# Patient Record
Sex: Female | Born: 1990 | Race: Asian | Hispanic: No | Marital: Single | Smoking: Never smoker
Health system: Southern US, Community
[De-identification: ages and names within clinical notes are randomized; demographics above are authoritative.]

## PROBLEM LIST (undated history)

## (undated) HISTORY — PX: ECTOPIC PREGNANCY SURGERY: SHX613

---

## 2013-04-12 ENCOUNTER — Other Ambulatory Visit: Payer: Self-pay | Admitting: Gastroenterology

## 2013-04-12 DIAGNOSIS — R102 Pelvic and perineal pain: Secondary | ICD-10-CM

## 2013-04-12 DIAGNOSIS — R1011 Right upper quadrant pain: Secondary | ICD-10-CM

## 2013-04-18 ENCOUNTER — Ambulatory Visit
Admission: RE | Admit: 2013-04-18 | Discharge: 2013-04-18 | Disposition: A | Discharge status: Home / Self Care | Payer: 59 | Source: Ambulatory Visit | Attending: Gastroenterology | Admitting: Gastroenterology

## 2013-04-18 ENCOUNTER — Ambulatory Visit
Admission: RE | Admit: 2013-04-18 | Discharge: 2013-04-18 | Disposition: A | Discharge status: Home / Self Care | Payer: No Typology Code available for payment source | Source: Ambulatory Visit | Attending: Gastroenterology | Admitting: Gastroenterology

## 2013-04-18 DIAGNOSIS — R102 Pelvic and perineal pain: Secondary | ICD-10-CM

## 2013-04-18 DIAGNOSIS — R1011 Right upper quadrant pain: Secondary | ICD-10-CM

## 2013-09-18 ENCOUNTER — Emergency Department (HOSPITAL_COMMUNITY)
Admission: EM | Admit: 2013-09-18 | Discharge: 2013-09-18 | Disposition: A | Discharge status: Home / Self Care | Payer: 59 | Attending: Emergency Medicine | Admitting: Emergency Medicine

## 2013-09-18 ENCOUNTER — Encounter (HOSPITAL_COMMUNITY): Payer: Self-pay | Admitting: Emergency Medicine

## 2013-09-18 DIAGNOSIS — N390 Urinary tract infection, site not specified: Secondary | ICD-10-CM

## 2013-09-18 DIAGNOSIS — Z3202 Encounter for pregnancy test, result negative: Secondary | ICD-10-CM | POA: Insufficient documentation

## 2013-09-18 DIAGNOSIS — Z880 Allergy status to penicillin: Secondary | ICD-10-CM | POA: Insufficient documentation

## 2013-09-18 LAB — URINALYSIS, ROUTINE W REFLEX MICROSCOPIC
BILIRUBIN URINE: NEGATIVE
Glucose, UA: NEGATIVE mg/dL
Ketones, ur: NEGATIVE mg/dL
NITRITE: NEGATIVE
Protein, ur: NEGATIVE mg/dL
SPECIFIC GRAVITY, URINE: 1.004 — AB (ref 1.005–1.030)
UROBILINOGEN UA: 0.2 mg/dL (ref 0.0–1.0)
pH: 6.5 (ref 5.0–8.0)

## 2013-09-18 LAB — URINE MICROSCOPIC-ADD ON

## 2013-09-18 LAB — POCT PREGNANCY, URINE: Preg Test, Ur: NEGATIVE

## 2013-09-18 MED ORDER — CIPROFLOXACIN HCL 250 MG PO TABS
250.0000 mg | ORAL_TABLET | Freq: Two times a day (BID) | ORAL | Status: AC
Start: 2013-09-18 — End: ?

## 2013-09-18 NOTE — ED Notes (Signed)
Patient is alert and oriented x3.  She is complaining of difficulty urinating. She states she can produce some urine but it is difficult.  She has been to  See a urologist and was told she needed to have a cystoscope done to find the Problem.  Currently she rates her pain 7 of 10.

## 2013-09-18 NOTE — ED Provider Notes (Signed)
CSN: [id number]     Arrival date & time 09/18/13  [id number] History   First MD Initiated Contact with Patient 09/18/13 2006     Chief Complaint  Patient presents with  . Dysuria     (Consider location/radiation/quality/duration/timing/severity/associated sxs/prior Treatment) The history is provided by the patient. No language interpreter was used.  Tara Carter is a 23 y/o F with PMHx of ectopic pregnancy surgery presenting to the ED with difficulty urinating that started yesterday. Patient reported that the only way that she can urinate is "squatting." Reported that she has been experiencing dysuria with each urination. Reported that she has been having suprapubic discomfort. Reported that she has a pressure sensation in the suprapubic region. Stated that she has not been drinking water because she does not want to have to continue to urinate. Patient reported that she has an appointment with Urology Friday - 09/22/2013. Patient reported that she frequently gets UTIs. Reported that she is not sexually active - stated that she has not had sex within the past month. Denied fever, chills, chest pain, shortness of breath, difficulty breathing, nausea, vomiting, diarrhea, melena, hematochezia, hematuria. PCP [facility]  History reviewed. No pertinent past medical history. Past Surgical History  Procedure Laterality Date  . Ectopic pregnancy surgery     History reviewed. No pertinent family history. History  Substance Use Topics  . Smoking status: Never Smoker   . Smokeless tobacco: Not on file  . Alcohol Use: Yes   OB History   Grav Para Term Preterm Abortions TAB SAB Ect Mult Living                 Review of Systems  Constitutional: Negative for fever and chills.  Respiratory: Negative for chest tightness and shortness of breath.   Cardiovascular: Negative for chest pain.  Gastrointestinal: Positive for abdominal pain. Negative for nausea, vomiting, diarrhea, constipation and blood in  stool.  Genitourinary: Positive for dysuria. Negative for hematuria, flank pain, vaginal bleeding, vaginal discharge and vaginal pain.  Musculoskeletal: Positive for back pain (lower back pain ). Negative for neck pain.  All other systems reviewed and are negative.      Allergies  Penicillins  Home Medications   Current Outpatient Rx  Name  Route  Sig  Dispense  Refill  . OVER THE COUNTER MEDICATION      Traditional Cong[place]inese medicine for throat.         Marland Kitchen. OVER THE COUNTER MEDICATION      Traditional chinese medicine for throat.         . ciprofloxacin (CIPRO) 250 MG tablet   Oral   Take 1 tablet (250 mg total) by mouth 2 (two) times daily.   14 tablet   0    BP 100/64  Pulse 78  Temp(Src) 98.3 F (36.8 C) (Oral)  Resp 18  Ht 5\' 3"  (1.6 m)  Wt 108 lb (48.988 kg)  BMI 19.14 kg/m2  SpO2 100%  LMP 08/14/2013 Physical Exam  Nursing note and vitals reviewed. Constitutional: She is oriented to person, place, and time. She appears well-developed and well-nourished. No distress.  HENT:  Head: Normocephalic and atraumatic.  Eyes: Conjunctivae and EOM are normal. Right eye exhibits no discharge. Left eye exhibits no discharge.  Neck: Normal range of motion. Neck supple. No tracheal deviation present.  Cardiovascular: Normal rate, regular rhythm and normal heart sounds.  Exam reveals no friction rub.   No murmur heard. Pulses:      Radial pulses are  2+ on the right side, and 2+ on the left side.  Pulmonary/Chest: Effort normal and breath sounds normal. No respiratory distress. She has no wheezes. She has no rales.  Abdominal: Soft. Bowel sounds are normal. There is tenderness. There is no guarding.  Musculoskeletal: Normal range of motion.  Full ROM to upper and lower extremities without difficulty noted, negative ataxia noted.  Lymphadenopathy:    She has no cervical adenopathy.  Neurological: She is alert and oriented to person, place, and time. She exhibits normal  muscle tone. Coordination normal.  Skin: Skin is warm and dry. She is not diaphoretic.    ED Course  Procedures (including critical care time)  Results for orders placed during the hospital encounter of 09/18/13  URINALYSIS, ROUTINE W REFLEX MICROSCOPIC      Result Value Ref Range   Color, Urine YELLOW  YELLOW   APPearance CLEAR  CLEAR   Specific Gravity, Urine 1.004 (*) 1.005 - 1.030   pH 6.5  5.0 - 8.0   Glucose, UA NEGATIVE  NEGATIVE mg/dL   Hgb urine dipstick TRACE (*) NEGATIVE   Bilirubin Urine NEGATIVE  NEGATIVE   Ketones, ur NEGATIVE  NEGATIVE mg/dL   Protein, ur NEGATIVE  NEGATIVE mg/dL   Urobilinogen, UA 0.2  0.0 - 1.0 mg/dL   Nitrite NEGATIVE  NEGATIVE   Leukocytes, UA TRACE (*) NEGATIVE  URINE MICROSCOPIC-ADD ON      Result Value Ref Range   Squamous Epithelial / LPF RARE  RARE   WBC, UA 0-2  <3 WBC/hpf   RBC / HPF 0-2  <3 RBC/hpf   Bacteria, UA RARE  RARE  POCT PREGNANCY, URINE      Result Value Ref Range   Preg Test, Ur NEGATIVE  NEGATIVE    Labs Review Labs Reviewed  URINALYSIS, ROUTINE W REFLEX MICROSCOPIC - Abnormal; Notable for the following:    Specific Gravity, Urine 1.004 (*)    Hgb urine dipstick TRACE (*)    Leukocytes, UA TRACE (*)    All other components within normal limits  URINE MICROSCOPIC-ADD ON  POCT PREGNANCY, URINE   Imaging Review No results found.  EKG Interpretation   None       MDM   Final diagnoses:  UTI (urinary tract infection)   Filed Vitals:   09/18/13 1936 09/18/13 2212  BP: 117/77 100/64  Pulse: 112 78  Temp: 97.6 F (36.4 C) 98.3 F (36.8 C)  TempSrc: Oral Oral  Resp: 20 18  Height: 5\' 3"  (1.6 m)   Weight: 108 lb (48.988 kg)   SpO2: 97% 100%    Patient presenting to emergency department with difficulty urinating starting yesterday. Stated that in order for her to urinate she has to "squat"-reported that each urination she is a burning sensation. Stated that she feels a pressure sensation to suprapubic  region. Stated that she frequently has urinary tract infections. Reported that she has appointment with urology on Friday, 09/22/2013. Stated that she's not sexually active-reported she has not been sexually active since one month ago. Alert and oriented. GCS 15. Heart rate and rhythm normal. Lungs clear to auscultation. Radial pulses 2+ bilaterally. Bowel sounds normoactive in all 4 quadrants, soft, negative pain upon palpation-mild discomfort upon palpation to suprapubic region. Negative acute abdomen, negative peritoneal signs-nonsurgical abdomen noted. Negative bilateral CVA tenderness. Urine pregnancy negative. Urinalysis noted trace hemoglobin with trace of leukocytes. Specific gravity low-1.004. Bladder scan 69 after urination. Patient requesting CT scan. Patient requesting to have cystoscopy performed -  reported that she has an appointment with Urology on Friday.  Doubt ectopic pregnancy. Doubt TOA. Doubt PID. Suspicion to be urinary tract infection. Patient stable, afebrile. Discharged patient. Discharged patient with antibiotics. Discussed with patient to rest and stay hydrated to drink plenty of fluids. Recommended diluted cranberry juice. Discussed with patient to keep appointment with urology on Friday, 09/22/2013. Referred to primary care provider. Discussed with patient to closely monitor symptoms and if symptoms are to worsen or change to report back to the ED - strict return instructions given.  Patient agreed to plan of care, understood, all questions answered.   Raymon Mutton, PA-C 09/20/13 1136

## 2013-09-18 NOTE — Discharge Instructions (Signed)
Please call your doctor for a followup appointment within 24-48 hours. When you talk to your doctor please let them know that you were seen in the emergency department and have them acquire all of your records so that they can discuss the findings with you and formulate a treatment plan to fully care for your new and ongoing problems. Please keep appointment with urology on Friday, 09/22/2013 Please take antibiotics as prescribed and on a full stomach Please rest and drink plenty of fluids Can drink diluted cranberry juice Please continue to monitor symptoms closely and if symptoms are to worsen or change (fever greater than 101, chills, sweating, chest pain, shortness of breath, difficulty breathing, nausea, vomiting, stomach pain, worsening discomfort upon urination, inability to urinate at all, changes to bowel movements, vaginal discharge, vaginal bleeding) please report back to the ED   Urinary Tract Infection Urinary tract infections (UTIs) can develop anywhere along your urinary tract. Your urinary tract is your body's drainage system for removing wastes and extra water. Your urinary tract includes two kidneys, two ureters, a bladder, and a urethra. Your kidneys are a pair of bean-shaped organs. Each kidney is about the size of your fist. They are located below your ribs, one on each side of your spine. CAUSES Infections are caused by microbes, which are microscopic organisms, including fungi, viruses, and bacteria. These organisms are so small that they can only be seen through a microscope. Bacteria are the microbes that most commonly cause UTIs. SYMPTOMS  Symptoms of UTIs may vary by age and gender of the patient and by the location of the infection. Symptoms in young women typically include a frequent and intense urge to urinate and a painful, burning feeling in the bladder or urethra during urination. Older women and men are more likely to be tired, shaky, and weak and have muscle aches and  abdominal pain. A fever may mean the infection is in your kidneys. Other symptoms of a kidney infection include pain in your back or sides below the ribs, nausea, and vomiting. DIAGNOSIS To diagnose a UTI, your caregiver will ask you about your symptoms. Your caregiver also will ask to provide a urine sample. The urine sample will be tested for bacteria and white blood cells. White blood cells are made by your body to help fight infection. TREATMENT  Typically, UTIs can be treated with medication. Because most UTIs are caused by a bacterial infection, they usually can be treated with the use of antibiotics. The choice of antibiotic and length of treatment depend on your symptoms and the type of bacteria causing your infection. HOME CARE INSTRUCTIONS  If you were prescribed antibiotics, take them exactly as your caregiver instructs you. Finish the medication even if you feel better after you have only taken some of the medication.  Drink enough water and fluids to keep your urine clear or pale yellow.  Avoid caffeine, tea, and carbonated beverages. They tend to irritate your bladder.  Empty your bladder often. Avoid holding urine for long periods of time.  Empty your bladder before and after sexual intercourse.  After a bowel movement, women should cleanse from front to back. Use each tissue only once. SEEK MEDICAL CARE IF:   You have back pain.  You develop a fever.  Your symptoms do not begin to resolve within 3 days. SEEK IMMEDIATE MEDICAL CARE IF:   You have severe back pain or lower abdominal pain.  You develop chills.  You have nausea or vomiting.  You  have continued burning or discomfort with urination. MAKE SURE YOU:   Understand these instructions.  Will watch your condition.  Will get help right away if you are not doing well or get worse. Document Released: 04/29/2005 Document Revised: 01/19/2012 Document Reviewed: 08/28/2011 [facility] Patient Information 2014  [place], [place].   Emergency Department Resource Guide 1) Find a Doctor and Pay Out of Pocket Although you won't have to find out who is covered by your insurance plan, it is a good idea to ask around and get recommendations. You will then need to call the office and see if the doctor you have chosen will accept you as a new patient and what types of options they offer for patients who are self-pay. Some doctors offer discounts or will set up payment plans for their patients who do not have insurance, but you will need to ask so you aren't surprised when you get to your appointment.  2) Contact Your Local Health Department Not all health departments have doctors that can see patients for sick visits, but many do, so it is worth a call to see if yours does. If you don't know where your local health department is, you can check in your phone book. The CDC also has a tool to help you locate your state's health department, and many state websites also have listings of all of their local health departments.  3) Find a Walk-in Clinic If your illness is not likely to be very severe or complicated, you may want to try a walk in clinic. These are popping up all over the country in pharmacies, drugstores, and shopping centers. They're usually staffed by nurse practitioners or physician assistants that have been trained to treat common illnesses and complaints. They're usually fairly quick and inexpensive. However, if you have serious medical issues or chronic medical problems, these are probably not your best option.  No Primary Care Doctor: - Call Health Connect at  [phone] - they can help you locate a primary care doctor that  accepts your insurance, provides certain services, etc. - Physician Referral Service- [phone]  Chronic Pain Problems: Organization         Address  Phone   Notes  [facility]  [phone] Patients need to be referred by their primary care doctor.    Medication Assistance: Organization         Address  Phone   Notes  [facility] [address] [place] [place], [place] [address] [phone] --Must be a resident of [facility] -- Must have NO insurance coverage whatsoever (no Medicaid/ Medicare, etc.) -- The pt. MUST have a primary care doctor that directs their care regularly and follows them in the community   [facility]  [phone]   [employer]  [phone]    Agencies that provide inexpensive medical care: Organization         Address  Phone   Notes  [facility]  [phone]   [facility]    [phone]   [facility] [address] [place], [place] [address] [phone]   [facility] [address] [place] [place] [phone]   Planned Parenthood    [phone]   [facility]    [phone]   Community [facility] and [facility]  [address], [place] Phone:  [phone], Fax:  [phone] Hours of Operation:  9 am - 6 pm, M-F.  Also accepts Medicaid/Medicare and self-pay.  [facility]  [address], [place] Phone: [phone], Fax: [phone]. Hours of Operation:  8:30 am - 5:30 pm, M-F.  Also accepts Medicaid and self-pay.  [facility] [place] [address], [place] Phone: [phone]   [facility] [address] Natasha Bence [place], [place] [phone], Ext. [phone] Mondays & Thursdays: 7-9 AM.  First 15 patients are seen on a first come, first serve basis.    Medicaid-accepting [facility] Providers:  Organization         Address  Phone   Notes  [facility] [address], [place] [phone] Also accepts self-pay patients.  [facility] [address] Laurell Josephs [place], [place]  [phone]   [facility] [address], [place] [phone]   [facility] [address] [place] [phone]   Renaye Rakers [address], [place]   [phone] Only accepts [place] Access [place] patients after they have their name applied to their card.   Self-Pay (no insurance) in [facility]:  Organization         Address  Phone   Notes  Sickle Cell Patients, [facility] [address] [place], [place] [phone]   [facility] [address] [place], [place] [phone]   [facility]  [address], [place] [phone]   [facility]/Dr. Osei-Bonsu  [address], [place] or [address], [place] [phone] Phone number for both [place] and [place] locations is the same.  [facility]gent Medical and [facility] [address] [place] [phone]   [facility] [address] [place] or [address] [phone]   [facility] [address], [place] [phone], phone; [phone], fax Sees patients 1st and 3rd Saturday of every month.  Must not qualify for public or private insurance (i.e. Medicaid, Medicare, [place] Health Choice, Veterans' Benefits)  Household income should be no more than 200% of the poverty level The clinic cannot treat you if you are pregnant or think you are pregnant  Sexually transmitted diseases are not treated at the clinic.    Dental Care: Organization         Address  Phone  Notes  [facility] Department of [facility] [address] [place], [place] [phone] Accepts children up to age 34 who are enrolled in [place] or [place] Health Choice; pregnant women with a Medicaid card; and children who have applied for Medicaid or [place] Health Choice, but were declined, whose parents can pay a reduced fee at time of service.    [facility]  [address], [place] [phone] Accepts children up to age 14 who are enrolled in [place] or [place] Health Choice; pregnant women with a Medicaid card; and children who have applied for Medicaid or [place] Health Choice, but were declined, whose parents can pay a reduced fee at time of service.  [facility]  [address] [place], [place] [phone] Patients are seen by appointment only. Walk-ins are not accepted. [facility] will see patients 63  years of age and older. Monday - Tuesday (8am-5pm) Most Wednesdays (8:30-5pm) $30 per visit, cash only  [facility]  [address], [place] [phone] Patients are seen by appointment only. Walk-ins are not accepted. [facility] will see patients 24 years of age and older. One Wednesday Evening (Monthly: Volunteer Based).  $30 per visit, cash only  [employer] [facility] [facility]  [phone] for adults; Children under age 704, call Graduate Pediatric Dentistry at [phone]. Children aged 534-14, please call [phone]) [phone] to request a pediatric application.  Dental services are provided in all areas of dental care including fillings, crowns and bridges, complete and partial dentures, implants, gum treatment, root canals, and extractions. Preventive care is also provided. Treatment is provided to both adults and children. Patients are selected via a lottery and there is often a waiting list.   [facility] [address], [place]  [phone]) [phone] [email or web address]   [facility] [address], [place], [place] [phone], Ext. [phone] Second and Fourth Thursday of each month, opens at 6:30 AM; Clinic ends at 9 AM.  Patients are seen on a first-come first-served basis, and a limited number are seen during each clinic.   [facility]  [address], [place], [place] [phone]  [phone]   Eligibility Requirements You must have lived in [place], [place], or [place] counties for at least the last three months.   You cannot be eligible for state or federal sponsored [place] Cityhealthcare insurance, including CIGNAVeterans Administration, [place]icaid, or [employer]icare.   You generally cannot be eligible for healthcare insurance through your employer.    How to apply: Eligibility screenings are held every Tuesday and Wednesday afternoon from 1:00 pm until 4:00 pm. You do not need an appointment for the interview!  [facility] [address], [place], [place] [phone]   [facility]  [phone]   [facility]  [phone]   [facility]  [phone]    Behavioral Health Resources in the Community: Intensive Outpatient Programs Organization         Address  Phone  Notes  [facility] [address], [place], [place] [phone]   [facility] [address], [place], [place] [phone]   [facility] [address], [place], [place]  [phone]   [facility] [address],  [place], [place] [phone] or [phone]   Substance Abuse Resources Organization         Address  Phone  Notes  [facility]  [phone]   [facility]  [phone]   [facility]  [phone]   [facility]  [phone]   Residential & Outpatient Substance Abuse Program  [phone]   Psychological Services Organization         Address  Phone  Notes  [facility]  [phone]- [phone]   [facility]  [phone]   [facility] [address], [place] [phone] or [phone]    Mobile Crisis Teams Organization         Address  Phone  Notes  [facility], Mobile Crisis Care Unit  [phone]   [facility]  [address] [place], [place] [phone]   [facility] [address] [place] [phone]    Self-Help/Support Groups Organization  Organization         Address  Phone             Notes  °[facility] [place] - variety of support groups  336- [phone] Call for more information  °Narcotics Anonymous (NA), [facility] [address], °[place]  2 meetings at this location  ° °Residential Treatment Programs °Organization         Address  Phone  Notes  °[facility] [address],    °[place]  [phone]   °[facility] ° [address], [place], [place] [phone]   °[facility] [address], [place] [phone] Admissions: 8am-3pm M-F  °[facility] [address]    °[place], [place] [phone]   °[facility] [address], [place], [place] [phone]   °[facility] [address]  °[place], [place] [phone]   °[facility] - Intensive Outpatient [address], [place], [place] [phone]   °[facility] ([facility] [address]  °[place], [place] [phone] or [phone]   °[facility] ([facility]) [address] , [place] [phone] Accepts Medicaid  °[facility] [address]  °[place] 1-800-659-3381 Substance Abuse/Addiction Treatment  ° °[place] [facility] Resources °Organization         Address  Phone  Notes  °[facility]  ([phone]) [phone]   °Julie Brannon, PhD [address] [place], [place]   ([phone]) [phone] or (336) [phone]   °[facility]   [address] °[place], [place] ([phone]) [phone]   °[facility] [address], [place], [place] ([phone]) [phone] Insurance/Medicaid/sponsorship through [facility]  °[facility] [address]                                    [place], [place] ([phone]) [phone]  Therapy/tele-psych/case  °[facility] [address]  ° [place],  ([phone]) [phone]    °Dr. Arfeen  ([phone]) [phone]   °[facility] [place]  [employer] [place] [facility] 1) [address], [place] °2) [address], [place] °3)  [address], [place] ([phone]) [phone] °([phone]) [phone] ° °([phone]) [phone]   °[place] Child Abuse Hotline (336) 342-1394 or (336) [phone] (After Hours)    ° ° ° °

## 2013-09-18 NOTE — ED Notes (Signed)
Bladder scan/ 69ml 

## 2013-09-22 NOTE — ED Provider Notes (Signed)
Medical screening examination/treatment/procedure(s) were performed by non-physician practitioner and as supervising physician I was immediately available for consultation/collaboration.  EKG Interpretation   None         Rolland PorterMark Yaeko Fazekas, MD 09/22/13 [phone]

## 2015-02-02 IMAGING — US US ABDOMEN LIMITED
1 series · 14 of 25 positions shown · non-contrast
Comparison: None.

CLINICAL DATA: Right upper quadrant pain.

EXAM:
US ABDOMEN LIMITED - RIGHT UPPER QUADRANT

[Series 1: us abdomen limited · 0.22mm/px · 14 of 45 slices shown]
[im 1/45]
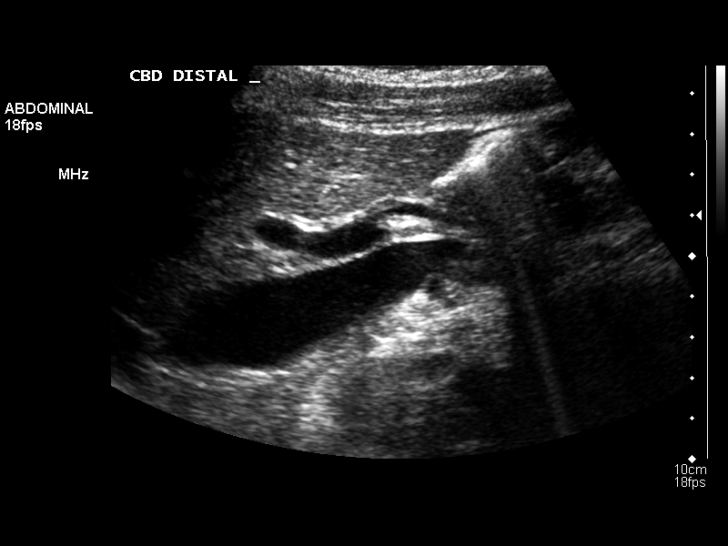
[im 4/45]
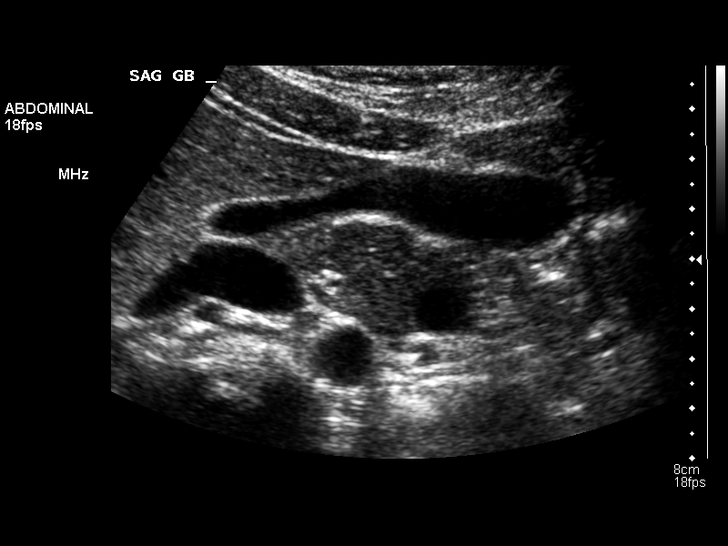
[im 8/45]
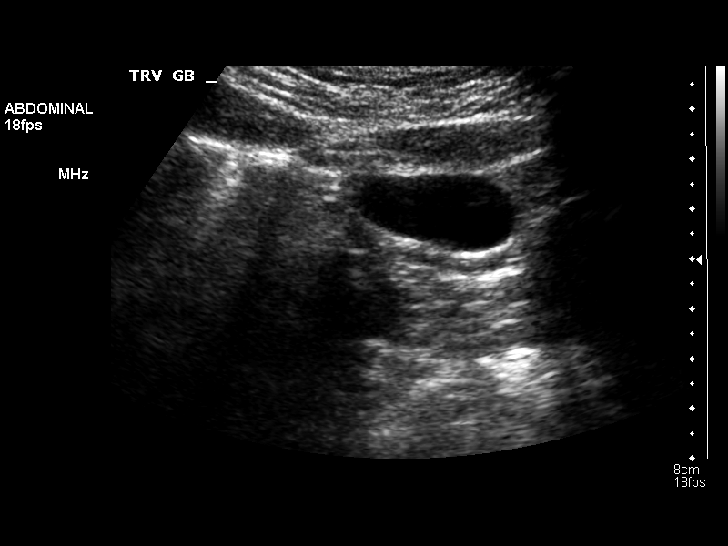
[im 12/45]
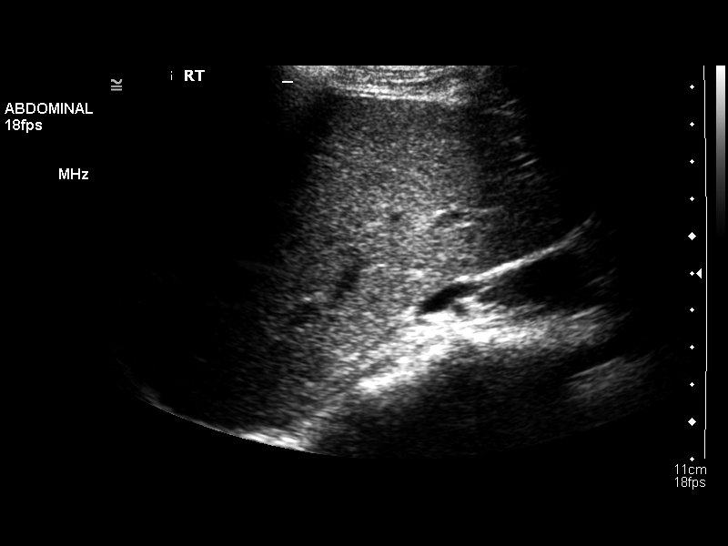
[im 15/45]
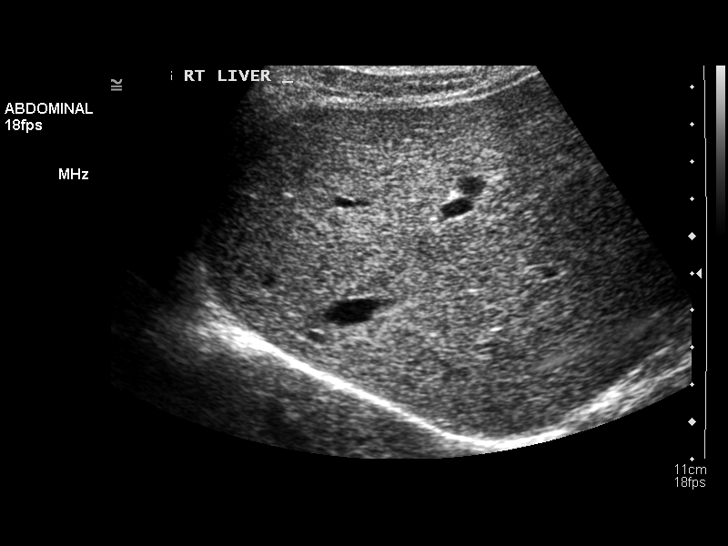
[im 17/45]
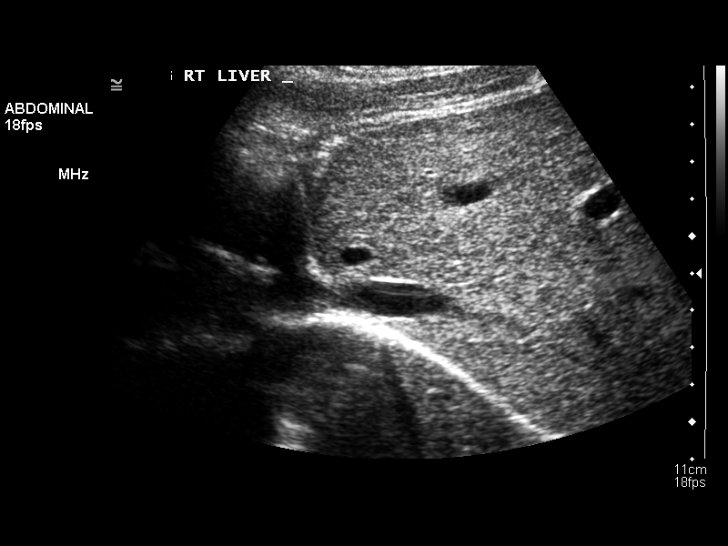
[im 21/45]
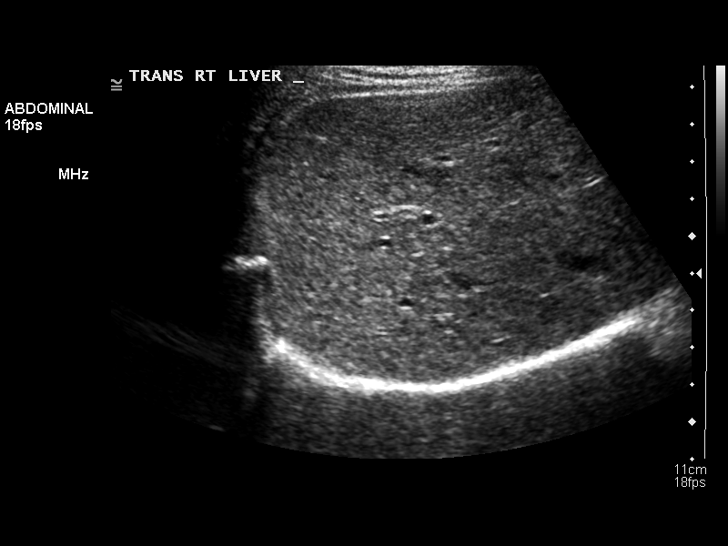
[im 24/45]
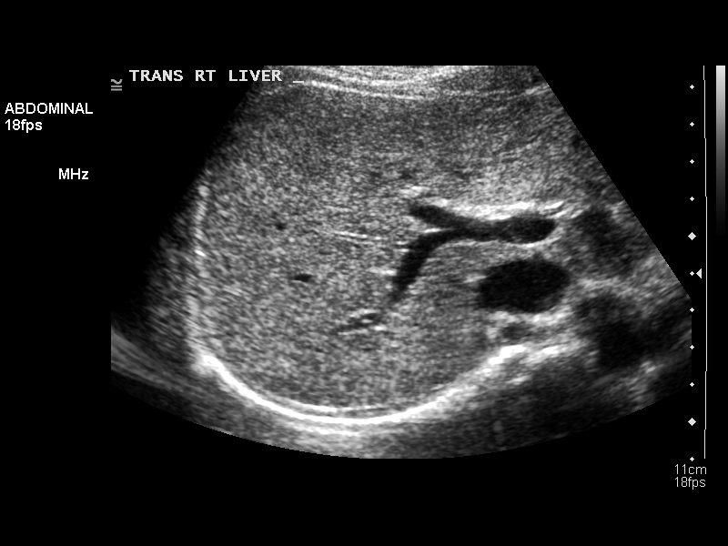
[im 28/45]
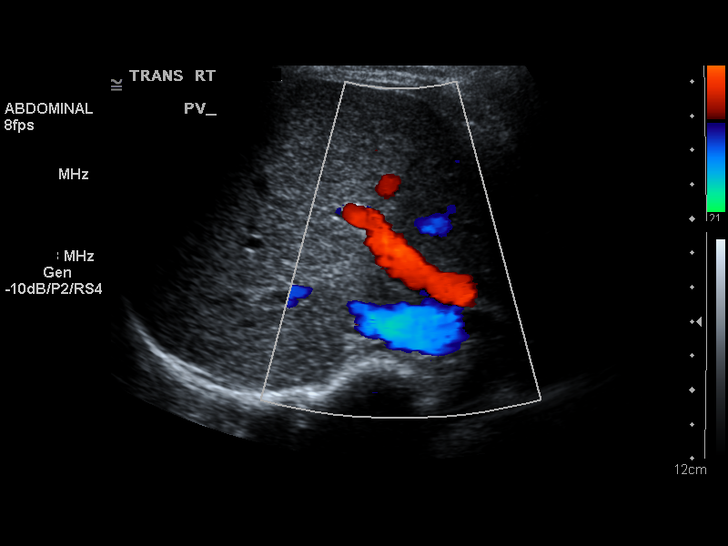
[im 30/45]
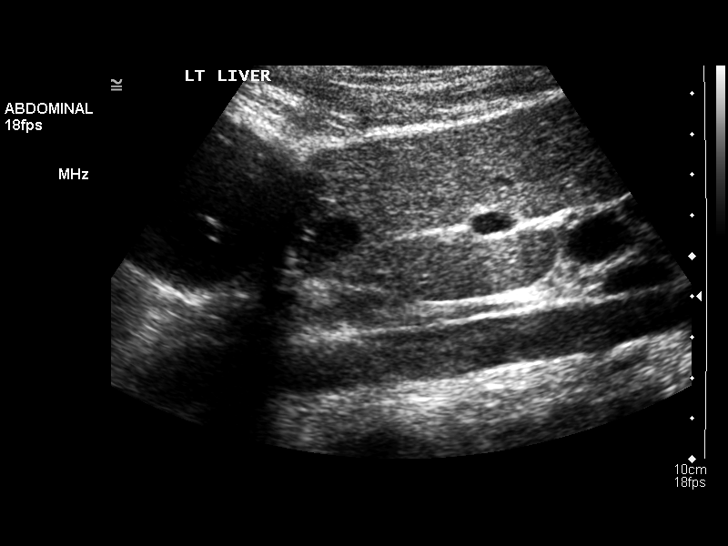
[im 34/45]
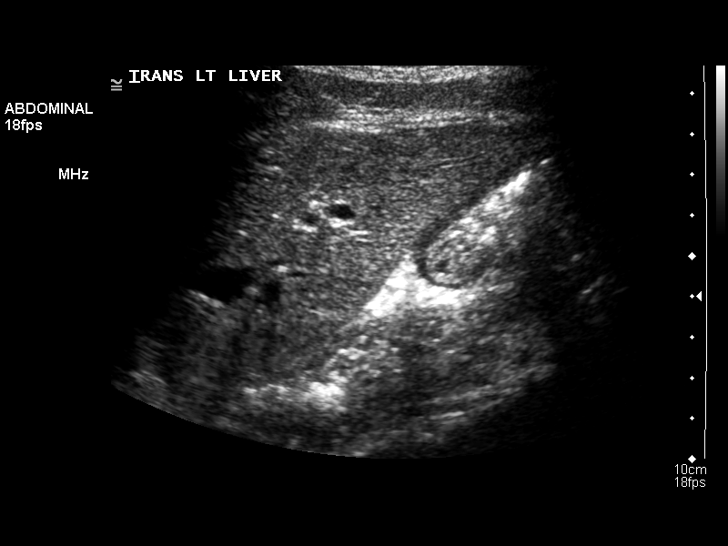
[im 37/45]
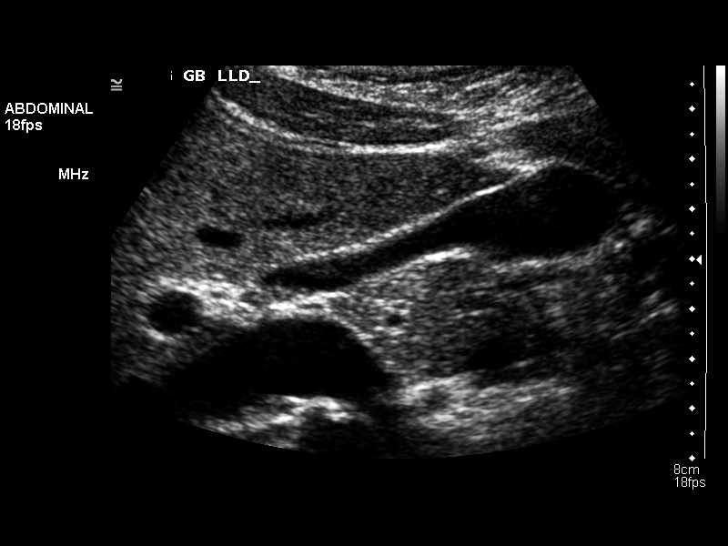
[im 41/45]
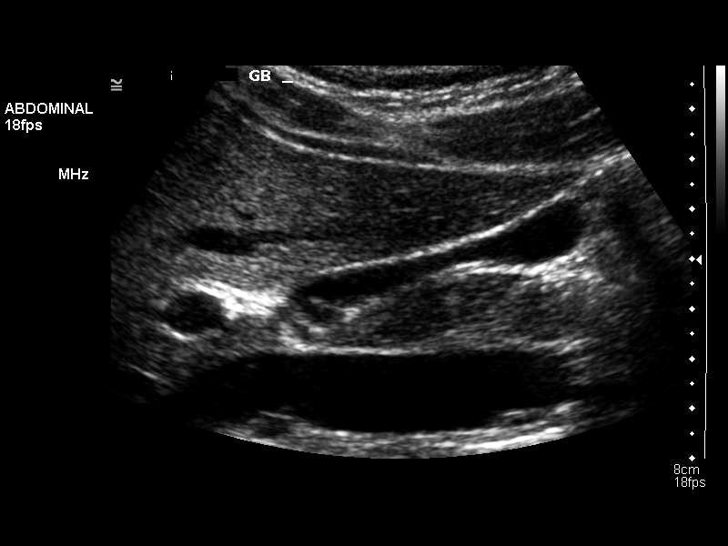
[im 45/45]
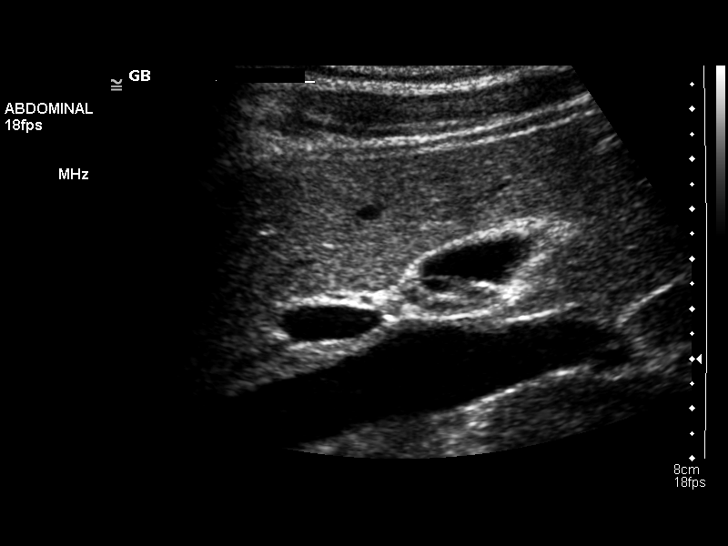

[14 of 25 positions shown; findings below may reference images not displayed]

FINDINGS: Gallbladder:

No gallstones, gallbladder wall thickening, or pericholecystic
fluid.

Common bile duct

Diameter: Normal caliber, 4 mm.

Liver:

No focal lesion identified. Within normal limits in parenchymal
echogenicity.
IMPRESSION: Negative.

## 2015-02-02 IMAGING — US US TRANSVAGINAL NON-OB
1 series · 14 of 25 positions shown · non-contrast
Comparison: None.

CLINICAL DATA: Pelvic pain



[Series 1: us transvaginal non-ob · 0.26mm/px · 14 of 61 slices shown]
[im 1/61]
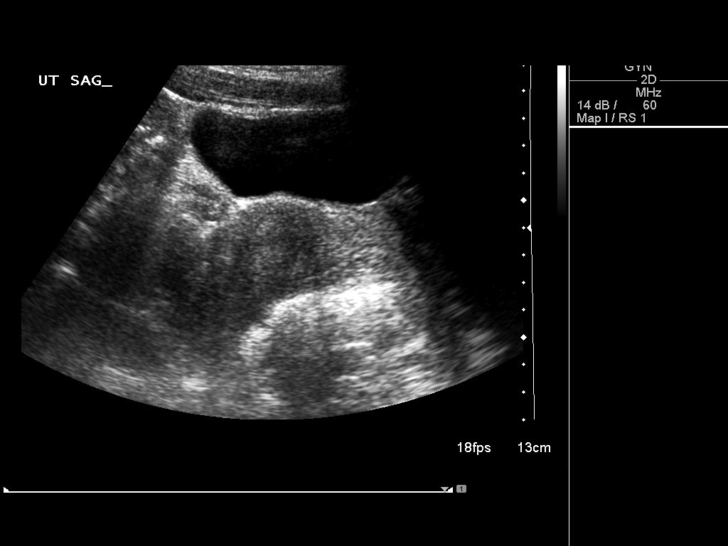
[im 6/61]
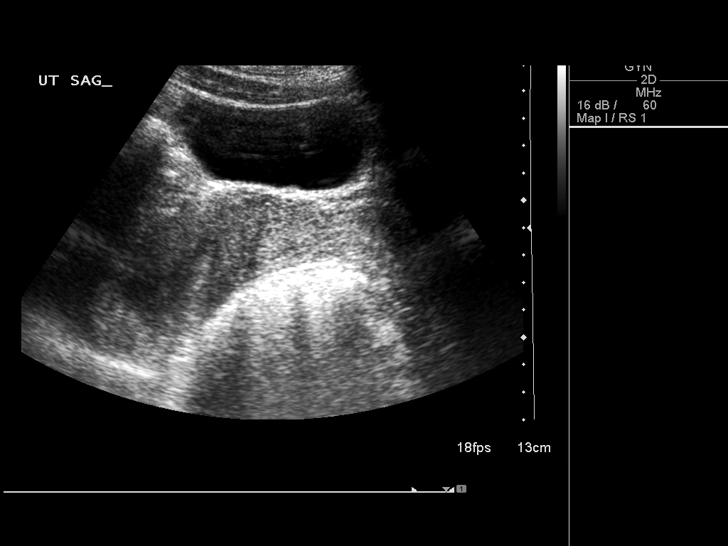
[im 11/61]
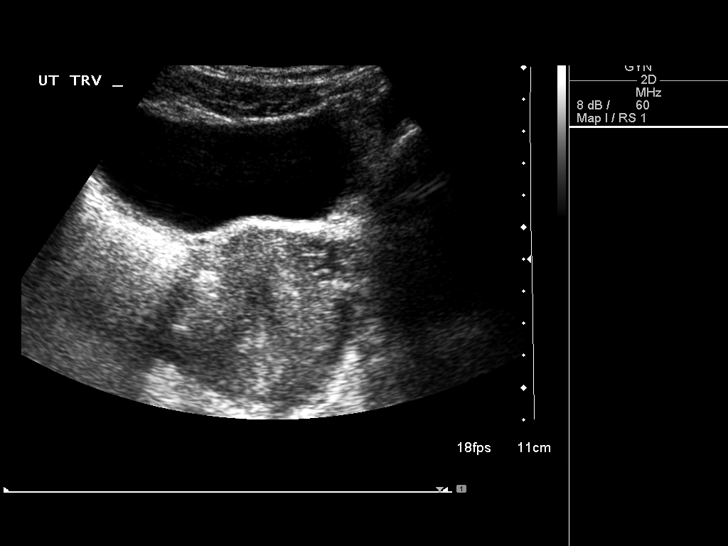
[im 16/61]
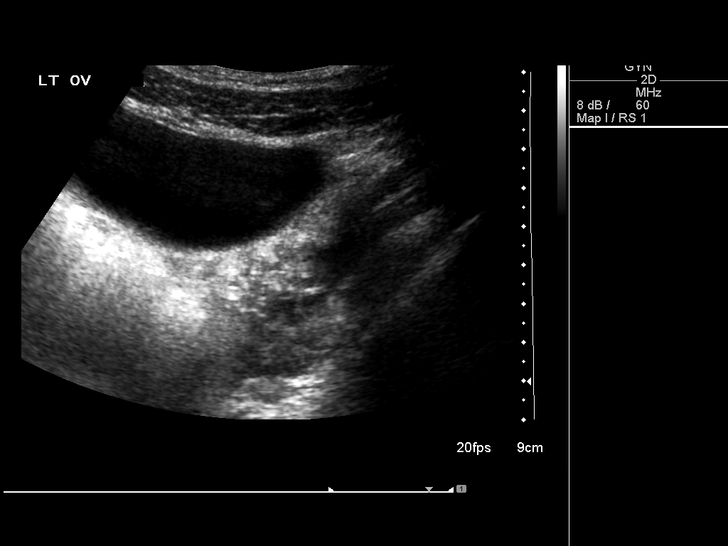
[im 21/61]
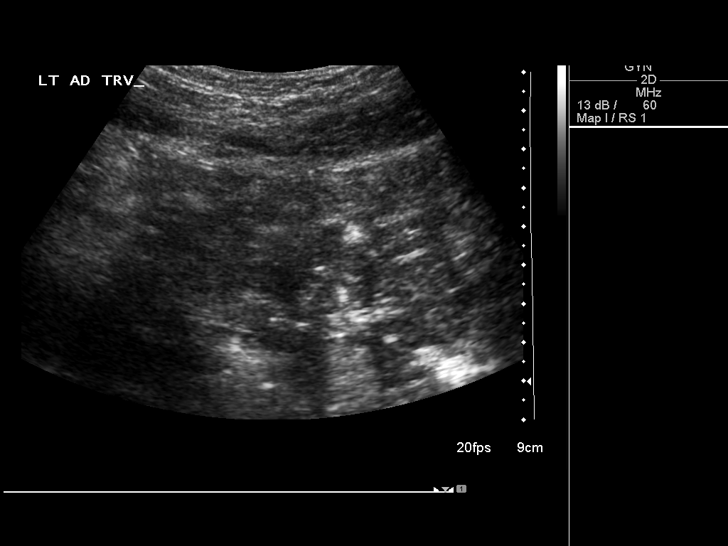
[im 23/61]
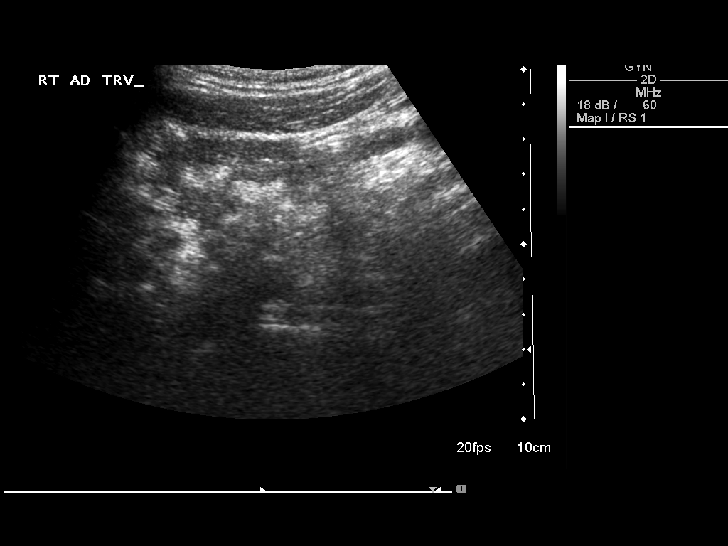
[im 28/61]
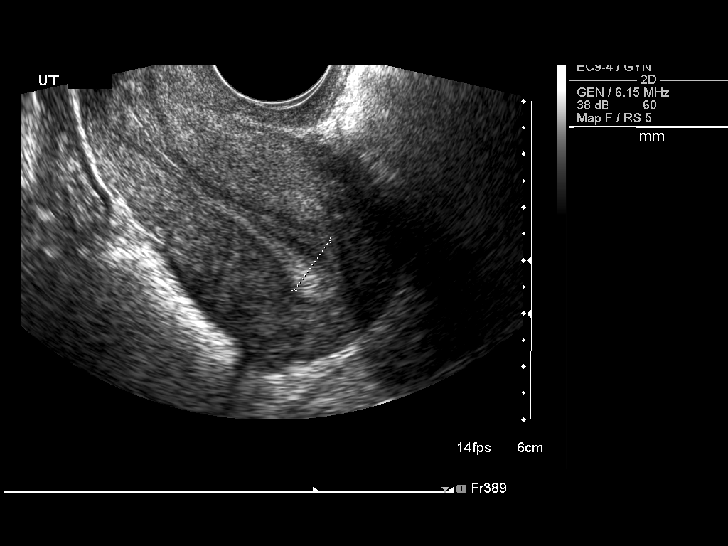
[im 33/61]
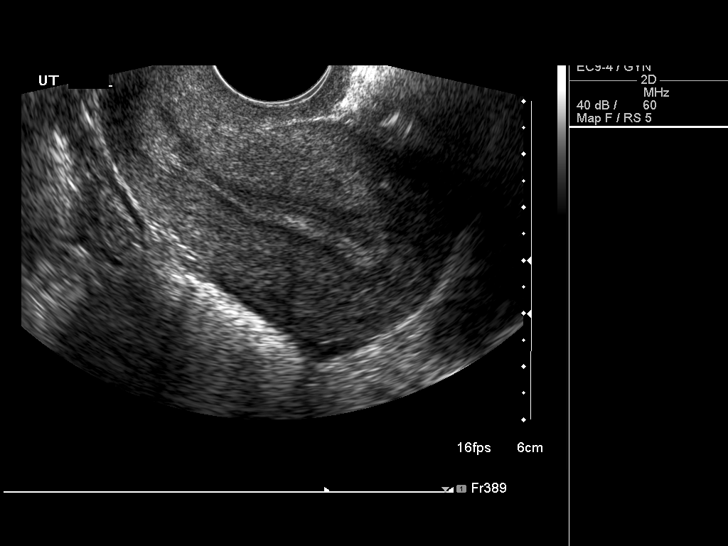
[im 38/61]
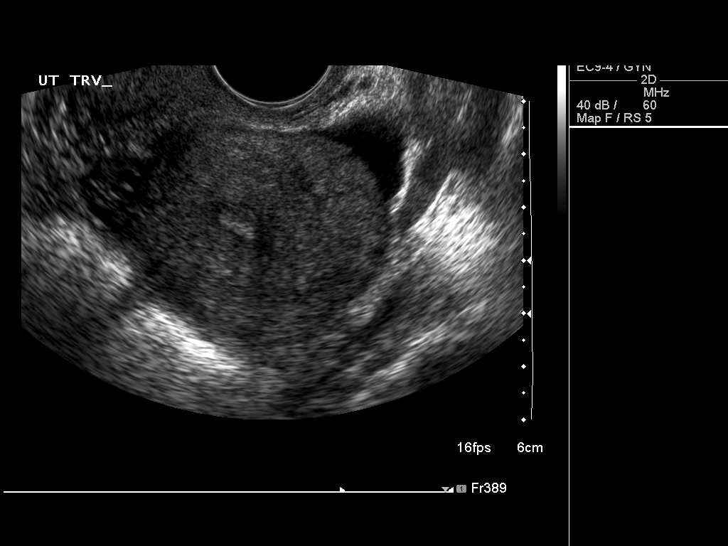
[im 41/61]
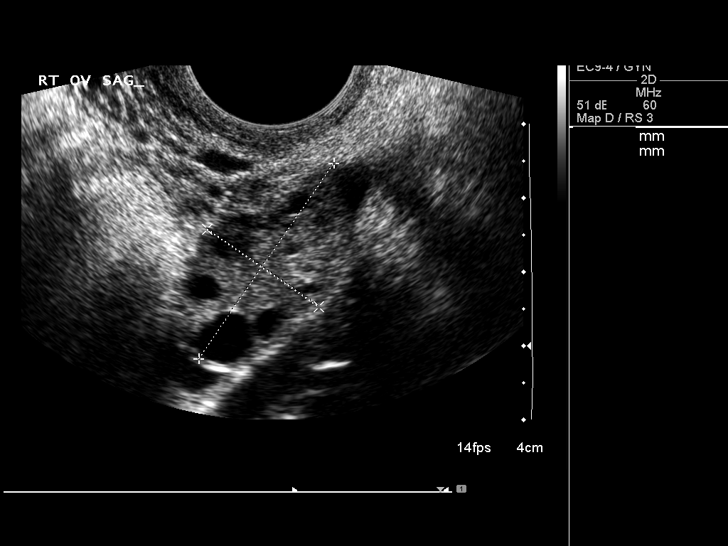
[im 46/61]
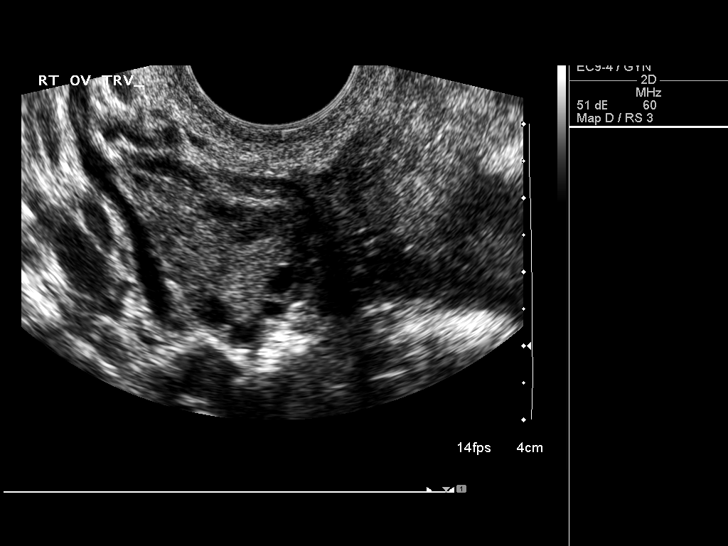
[im 51/61]
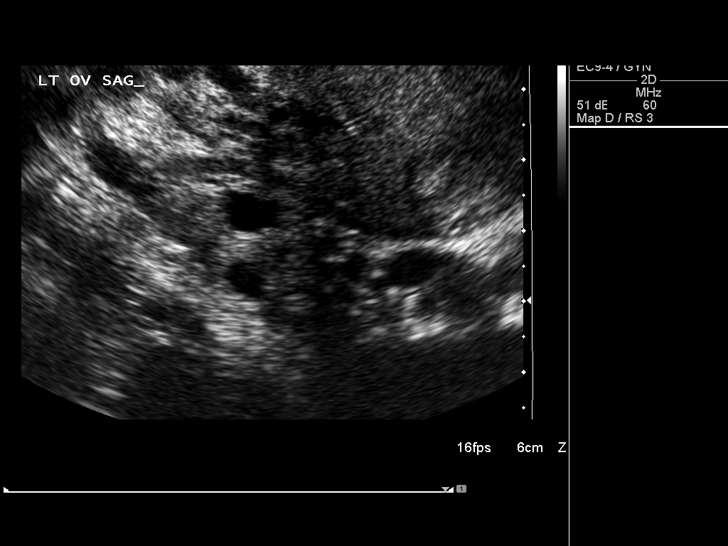
[im 56/61]
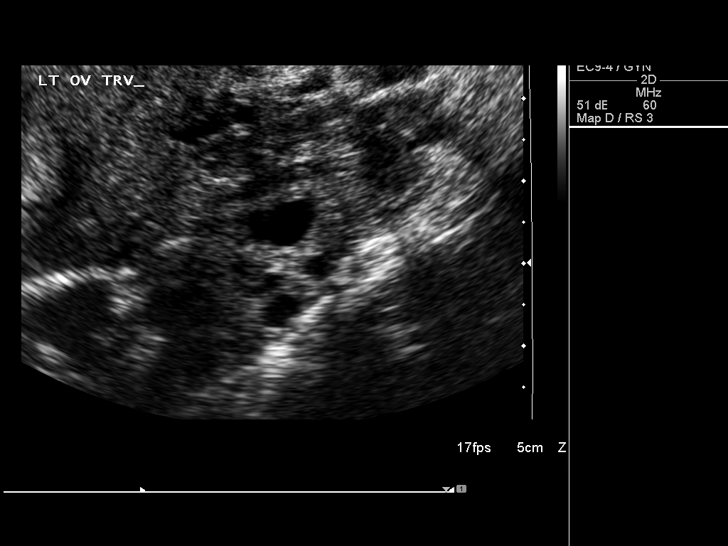
[im 61/61]
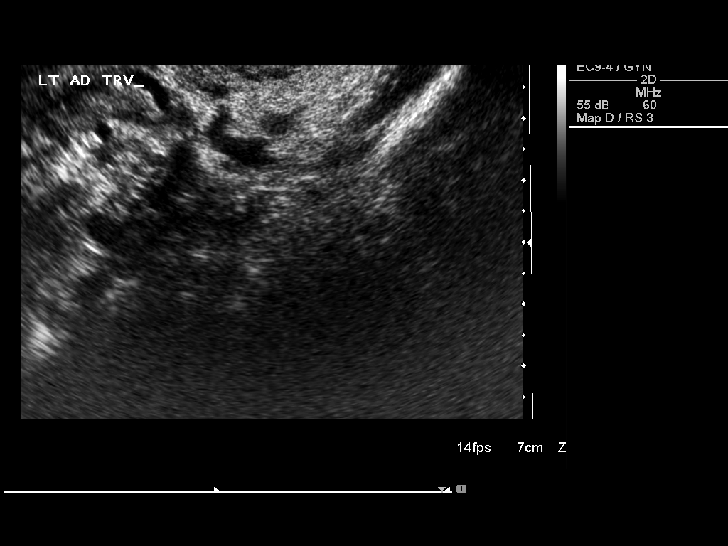

[14 of 25 positions shown; findings below may reference images not displayed]

FINDINGS: Uterus:  The uterus measures 7.3 x 4.4 x 5.3 cm.  The uterus has a
retroverted orientation.  No mass identified.

Endometrium: Measures 11.7 mm.  Normal in thickness and appearance

Right ovary: Measures 3.2 x 1.8 x 2.1 cm.   Normal appearance/no
adnexal mass.

Left ovary: Measures 2.9 x 1.6 x 1.8 cm.  Normal in appearance/no
adnexal mass.

Other Findings:  Trace free fluid is identified within the pelvis.
IMPRESSION: 1.  Trace free fluid within the pelvis may be physiologic in a
premenopausal female.
2.}  Examination is otherwise normal.
